# Patient Record
Sex: Male | Born: 1970 | Race: White | Hispanic: No | Smoking: Never smoker
Health system: Southern US, Community
[De-identification: ages and names within clinical notes are randomized; demographics above are authoritative.]

## PROBLEM LIST (undated history)

## (undated) DIAGNOSIS — E785 Hyperlipidemia, unspecified: Secondary | ICD-10-CM

## (undated) HISTORY — DX: Hyperlipidemia, unspecified: E78.5

## (undated) HISTORY — PX: APPENDECTOMY: SHX54

---

## 2003-04-05 ENCOUNTER — Emergency Department (HOSPITAL_COMMUNITY): Admission: EM | Admit: 2003-04-05 | Discharge: 2003-04-05 | Payer: Self-pay | Admitting: Emergency Medicine

## 2003-04-05 ENCOUNTER — Encounter: Payer: Self-pay | Admitting: Emergency Medicine

## 2003-04-06 ENCOUNTER — Emergency Department (HOSPITAL_COMMUNITY): Admission: EM | Admit: 2003-04-06 | Discharge: 2003-04-06 | Payer: Self-pay | Admitting: Emergency Medicine

## 2004-09-18 ENCOUNTER — Emergency Department (HOSPITAL_COMMUNITY): Admission: EM | Admit: 2004-09-18 | Discharge: 2004-09-18 | Payer: Self-pay | Admitting: *Deleted

## 2007-07-23 ENCOUNTER — Emergency Department (HOSPITAL_COMMUNITY): Admission: EM | Admit: 2007-07-23 | Discharge: 2007-07-23 | Payer: Self-pay | Admitting: Emergency Medicine

## 2011-03-12 LAB — URINE MICROSCOPIC-ADD ON

## 2011-03-12 LAB — BASIC METABOLIC PANEL
BUN: 10
Chloride: 104
GFR calc non Af Amer: >60
Potassium: 3.4 — ABNORMAL LOW
Sodium: 140

## 2011-03-12 LAB — URINALYSIS, ROUTINE W REFLEX MICROSCOPIC
Bilirubin Urine: NEGATIVE
Glucose, UA: NEGATIVE
Ketones, ur: NEGATIVE
Leukocytes, UA: NEGATIVE
Protein, ur: NEGATIVE

## 2011-03-12 LAB — DIFFERENTIAL
Eosinophils Absolute: 0.1
Eosinophils Relative: 2
Lymphocytes Relative: 32
Lymphs Abs: 2
Monocytes Absolute: 0.4
Monocytes Relative: 6

## 2011-03-12 LAB — CBC
HCT: 39.3
Hemoglobin: 14
MCV: 87.5
Platelets: 270
RBC: 4.49
WBC: 6.2

## 2015-03-28 ENCOUNTER — Encounter: Payer: Self-pay | Admitting: Pediatrics

## 2015-03-28 ENCOUNTER — Ambulatory Visit (INDEPENDENT_AMBULATORY_CARE_PROVIDER_SITE_OTHER): Payer: BLUE CROSS/BLUE SHIELD | Admitting: Pediatrics

## 2015-03-28 ENCOUNTER — Ambulatory Visit (INDEPENDENT_AMBULATORY_CARE_PROVIDER_SITE_OTHER): Payer: BLUE CROSS/BLUE SHIELD

## 2015-03-28 VITALS — BP 131/82 | HR 79 | Temp 99.2°F

## 2015-03-28 DIAGNOSIS — M109 Gout, unspecified: Secondary | ICD-10-CM

## 2015-03-28 MED ORDER — NAPROXEN 500 MG PO TABS: 500.0000 mg | ORAL_TABLET | Freq: Two times a day (BID) | ORAL | Status: DC

## 2015-03-28 NOTE — Progress Notes (Signed)
    Subjective:    Patient ID: Gerald Holland, male    DOB: 03/12/1971, 45 y.o.   MRN: [id number]  CC: ankle pain  HPI: Gerald Holland is a 44 y.o. male presenting on 03/28/2015 for Foot Swelling  Woke up two days ago with pain and swelling, redness in L ankle. Ibuprofen helped a little bit Worked ten hours last night, on his feet, can walk but it hurts to weight bear No fevers, feeling otherwise well. H/o 3 kidney stones, all more than 8 years ago. His brother has gout  He doesn't know what kind of kidney stones he had, passed them. Abd surgery in 6th grade, took out appendix and "sack that ruptured". Also has had cold symptoms for apprx the last week, taking mucinex. No injury to ankle, no cuts or scrapes.   Relevant past medical, surgical, family and social history reviewed and updated. Medical history  reviewed. Allergies and medications reviewed.  ROS: All systems negative other than what is in HPI  Past Medical History There are no active problems to display for this patient.   Current Outpatient Prescriptions  Medication Sig Dispense Refill  . naproxen (NAPROSYN) 500 MG tablet Take 1 tablet (500 mg total) by mouth 2 (two) times daily with a meal. 60 tablet 1   No current facility-administered medications for this visit.       Objective:    BP 131/82 mmHg  Pulse 79  Temp(Src) 99.2 F (37.3 C) (Oral)  Wt Readings from Last 3 Encounters:  No data found for Wt    Gen: NAD, alert, cooperative with exam, NCAT EYES: EOMI, no scleral injection or icterus ENT:  TMs pearly gray b/l, OP without erythema LYMPH: no cervical LAD CV: NRRR, normal S1/S2, no murmur, DP pulses 2+ b/l Resp: CTABL, no wheezes, normal WOB Abd: +BS, soft, NTND. no guarding or organomegaly Neuro: Alert and oriented, strength equal b/l UE and LE   MSK: L ankle red, swollen, able to wiggle toes without pain, minimal ROM of ankle, limited by pain. Pain with palpation lateral side of ankle. No point  tenderness over fibula, or metatarsals.     Assessment & Plan:    Gerald Holland was seen today for foot swelling that started suddenly one morning. No cuts or scarpes around ankle, is otherwise healthy, no fevers, no systemic illness. Given suddenness of onset, no injury, no fevers, most likely gout. Ideally would have fluid to send, discussed with pt trial of NSAIDs, will send CBC as well. If he has fevers he needs to be seen, evaluated for septic arthritis.   Diagnoses and all orders for this visit:  Acute gout, unspecified cause, unspecified site -     CBC with Differential -     DG Ankle Complete Left; Future -     naproxen (NAPROSYN) 500 MG tablet; Take 1 tablet (500 mg total) by mouth 2 (two) times daily with a meal   Follow up plan: Return if symptoms worsen or fail to improve.  Rex Kras, MD [facility] 03/28/2015, 3:42 PM

## 2015-03-29 LAB — CBC WITH DIFFERENTIAL/PLATELET
BASOS ABS: 0 10*3/uL (ref 0.0–0.2)
Basos: 0 %
EOS (ABSOLUTE): 0.3 10*3/uL (ref 0.0–0.4)
EOS: 4 %
HEMATOCRIT: 43.6 % (ref 37.5–51.0)
HEMOGLOBIN: 15 g/dL (ref 12.6–17.7)
IMMATURE GRANS (ABS): 0 10*3/uL (ref 0.0–0.1)
Immature Granulocytes: 0 %
LYMPHS ABS: 2.2 10*3/uL (ref 0.7–3.1)
LYMPHS: 25 %
MCH: 30.7 pg (ref 26.6–33.0)
MCHC: 34.4 g/dL (ref 31.5–35.7)
MCV: 89 fL (ref 79–97)
MONOCYTES: 8 %
Monocytes Absolute: 0.7 10*3/uL (ref 0.1–0.9)
NEUTROS ABS: 5.5 10*3/uL (ref 1.4–7.0)
Neutrophils: 63 %
Platelets: 249 10*3/uL (ref 150–379)
RBC: 4.89 x10E6/uL (ref 4.14–5.80)
RDW: 13.9 % (ref 12.3–15.4)
WBC: 8.8 10*3/uL (ref 3.4–10.8)

## 2015-04-03 ENCOUNTER — Encounter: Payer: Self-pay | Admitting: Family Medicine

## 2015-04-03 ENCOUNTER — Ambulatory Visit (INDEPENDENT_AMBULATORY_CARE_PROVIDER_SITE_OTHER): Payer: BLUE CROSS/BLUE SHIELD | Admitting: Family Medicine

## 2015-04-03 VITALS — BP 119/82 | HR 70 | Temp 97.2°F | Ht 77.0 in | Wt 264.0 lb

## 2015-04-03 DIAGNOSIS — M10072 Idiopathic gout, left ankle and foot: Secondary | ICD-10-CM

## 2015-04-03 DIAGNOSIS — M109 Gout, unspecified: Secondary | ICD-10-CM

## 2015-04-03 MED ORDER — PREDNISONE 20 MG PO TABS: ORAL_TABLET | ORAL | Status: DC

## 2015-04-03 MED ORDER — TRAMADOL HCL 50 MG PO TABS: 50.0000 mg | ORAL_TABLET | Freq: Three times a day (TID) | ORAL | Status: DC | PRN

## 2015-04-03 NOTE — Progress Notes (Signed)
   HPI  Patient presents today for acute gout flare.  Patient explains that he's had symptoms for about 6-7 days. He was started on naproxen which helped some but the pain continues. He had a normal x-ray and a normal CBC. He denies fever, chills, sweats, or previous gout attacks. He has some family history of gout, his brother has gout. He is no inciting events. 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   Meds ordered this encounter  Medications  . predniSONE (DELTASONE) 20 MG tablet    Sig: Take 2 tabs daily for 4 days, then take 1 tab daily for 4 days, then take one half tab daily 4 days then stop.    Dispense:  14 tablet    Refill:  0  . traMADol (ULTRAM) 50 MG tablet    Sig: Take 1 tablet (50 mg total) by mouth every 8 (eight) hours as needed.    Dispense:  20 tablet    Refill:  0    Murtis SinkSam Vincenzina Jagoda, MD [facility] 04/03/2015, 9:40 AM

## 2015-04-03 NOTE — Patient Instructions (Signed)
Great to meet you guys!  Tramadol is a pian medicine to take only as needed, do not drive while taking it.    Gout Gout is when your joints become red, sore, and swell (inflamed). This is caused by the buildup of uric acid crystals in the joints. Uric acid is a chemical that is normally in the blood. If the level of uric acid gets too high in the blood, these crystals form in your joints and tissues. Over time, these crystals can form into masses near the joints and tissues. These masses can destroy bone and cause the bone to look misshapen (deformed). HOME CARE   Do not take aspirin for pain.  Only take medicine as told by your doctor.  Rest the joint as much as you can. When in bed, keep sheets and blankets off painful areas.  Keep the sore joints raised (elevated).  Put warm or cold packs on painful joints. Use of warm or cold packs depends on which works best for you.  Use crutches if the painful joint is in your leg.  Drink enough fluids to keep your pee (urine) clear or pale yellow. Limit alcohol, sugary drinks, and drinks with fructose in them.  Follow your diet instructions. Pay careful attention to how much protein you eat. Include fruits, vegetables, whole grains, and fat-free or low-fat milk products in your daily diet. Talk to your doctor or dietitian about the use of coffee, vitamin C, and cherries. These may help lower uric acid levels.  Keep a healthy body weight. GET HELP RIGHT AWAY IF:   You have watery poop (diarrhea), throw up (vomit), or have any side effects from medicines.  You do not feel better in 24 hours, or you are getting worse.  Your joint becomes suddenly more tender, and you have chills or a fever. MAKE SURE YOU:   Understand these instructions.  Will watch your condition.  Will get help right away if you are not doing well or get worse.   This information is not intended to replace advice given to you by your health care provider. Make sure you  discuss any questions you have with your health care provider.   Document Released: 03/16/2008 Document Revised: 06/28/2014 Document Reviewed: 01/19/2012 Elsevier Interactive Patient Education [employer]2016 [employer]

## 2016-01-01 ENCOUNTER — Encounter: Payer: Self-pay | Admitting: Pediatrics

## 2016-01-01 ENCOUNTER — Ambulatory Visit (INDEPENDENT_AMBULATORY_CARE_PROVIDER_SITE_OTHER): Payer: BLUE CROSS/BLUE SHIELD | Admitting: Pediatrics

## 2016-01-01 VITALS — BP 143/96 | HR 100 | Temp 97.9°F | Ht 77.0 in | Wt 269.0 lb

## 2016-01-01 DIAGNOSIS — IMO0001 Reserved for inherently not codable concepts without codable children: Secondary | ICD-10-CM

## 2016-01-01 DIAGNOSIS — R030 Elevated blood-pressure reading, without diagnosis of hypertension: Secondary | ICD-10-CM | POA: Diagnosis not present

## 2016-01-01 DIAGNOSIS — M10072 Idiopathic gout, left ankle and foot: Secondary | ICD-10-CM | POA: Diagnosis not present

## 2016-01-01 DIAGNOSIS — M109 Gout, unspecified: Secondary | ICD-10-CM

## 2016-01-01 MED ORDER — PREDNISONE 20 MG PO TABS
ORAL_TABLET | ORAL | Status: DC
Start: 2016-01-01 — End: 2016-09-27

## 2016-01-01 NOTE — Progress Notes (Signed)
    Subjective:    Patient ID: Gerald Holland, male    DOB: 05/25/1971, 45 y.o.   MRN: [id number]  CC: Foot Pain   HPI: Gerald Holland is a 45 y.o. male presenting for Foot Pain  Yesterday foot slightly sore Woke up this morning with very tender L foot Anything touching foot hurts it Taking BC powder today Ibuprofen 800mg  once yesterday Had gout flare apprx 8 months ago, at that time L ankle was more involved than L foot No fevers No injuries to foot  Otherwise feeling well No headaches, chest pain, vision changes  Relevant past medical, surgical, family and social history reviewed and updated as indicated.  Interim medical history since our last visit reviewed. Allergies and medications reviewed and updated.  ROS: Per HPI     Objective:    BP 143/96 mmHg  Pulse 100  Temp(Src) 97.9 F (36.6 C) (Oral)  Ht 6\' 5"  (1.956 m)  Wt 269 lb (122.018 kg)  BMI 31.89 kg/m2  Wt Readings from Last 3 Encounters:  01/01/16 269 lb (122.018 kg)  04/03/15 264 lb (119.75 kg)     Gen: NAD, alert, cooperative with exam, NCAT EYES: EOMI, no scleral injection or icterus Ext: L foot slightly swollen  Neuro: Alert and oriented MSK: Tender with slightest touch over L lateral side of foot, area slightly erythematous No breask in skin Nl ROM ankle, can wiggle toes, but hurts L side of foot Can walk on foot but same area hurts     Assessment & Plan:    Gerald Holland was seen today for foot pain.  Diagnoses and all orders for this visit:  Acute gout of left foot, unspecified cause -     predniSONE (DELTASONE) 20 MG tablet; Take 2 tabs daily for 4 days, then take 1 tab daily for 4 days, then take one half tab daily 4 days then stop.  Ibuprofen 800mg  three times a day  OR   Naproxen 500mg  twice a day for next 7 days or until improving Start as soon as symptoms start with next gout flare  Avoid foods that cause gout  Elevated blood pressure RTC for recheck in 2 weeks  Follow up  plan: 2 week nurse visit for BP check  Rex Krasarol Stanely Sexson, MD [facility] 01/01/2016, 5:14 PM

## 2016-01-01 NOTE — Patient Instructions (Signed)
Ibuprofen 800mg  three times a day  OR   Naproxen 500mg  twice a day for next 7 days or until improving Start as soon as symptoms start with next gout flare

## 2016-09-27 ENCOUNTER — Ambulatory Visit (INDEPENDENT_AMBULATORY_CARE_PROVIDER_SITE_OTHER): Payer: BLUE CROSS/BLUE SHIELD

## 2016-09-27 ENCOUNTER — Ambulatory Visit (INDEPENDENT_AMBULATORY_CARE_PROVIDER_SITE_OTHER): Payer: BLUE CROSS/BLUE SHIELD | Admitting: Family Medicine

## 2016-09-27 ENCOUNTER — Encounter: Payer: Self-pay | Admitting: Family Medicine

## 2016-09-27 ENCOUNTER — Encounter: Payer: Self-pay | Admitting: *Deleted

## 2016-09-27 VITALS — BP 127/74 | HR 93 | Temp 98.5°F | Ht 77.0 in | Wt 271.0 lb

## 2016-09-27 DIAGNOSIS — M79672 Pain in left foot: Secondary | ICD-10-CM | POA: Diagnosis not present

## 2016-09-27 DIAGNOSIS — S93602A Unspecified sprain of left foot, initial encounter: Secondary | ICD-10-CM

## 2016-09-27 NOTE — Patient Instructions (Addendum)
Use ice off and on for the first 48 hours then warm wet compresses Take Aleve 1 twice daily after breakfast and supper for 7-10 days unless it irritates the stomach We will give you a note for work for the next 2-3 days Elevate the foot when possible The x-rays that were reviewed by me appeared to be normal and we will get an official report from the radiologist and call you soon as that becomes available Wear Ace bandage and apply daily or reapply if it becomes loose and Remain out of work for the next 2-3 days as directed

## 2016-09-27 NOTE — Progress Notes (Signed)
   Subjective:    Patient ID: Gerald Holland, male    DOB: [birth year], 46 y.o.   MRN: [id number]  HPI  Patient here today for left foot pain. Yesterday while playing basketball, he heard a "pop".     Patient Active Problem List   Diagnosis Date Noted  . Gout attack 04/03/2015   Outpatient Encounter Prescriptions as of 09/27/2016  Medication Sig  . [DISCONTINUED] naproxen (NAPROSYN) 500 MG tablet Take 1 tablet (500 mg total) by mouth 2 (two) times daily with a meal.  . [DISCONTINUED] predniSONE (DELTASONE) 20 MG tablet Take 2 tabs daily for 4 days, then take 1 tab daily for 4 days, then take one half tab daily 4 days then stop.   No facility-administered encounter medications on file as of 09/27/2016.      Review of Systems  Constitutional: Negative.   HENT: Negative.   Eyes: Negative.   Respiratory: Negative.   Cardiovascular: Negative.   Gastrointestinal: Negative.   Endocrine: Negative.   Genitourinary: Negative.   Musculoskeletal: Positive for arthralgias (left foot pain).  Skin: Negative.   Allergic/Immunologic: Negative.   Neurological: Negative.   Hematological: Negative.   Psychiatric/Behavioral: Negative.        Objective:   Physical Exam  Constitutional: He is oriented to person, place, and time. He appears well-developed and well-nourished. No distress.  The patient is a pleasant and alert large framed person that is not overweight.  HENT:  Head: Normocephalic.  Eyes: Conjunctivae and EOM are normal. Pupils are equal, round, and reactive to light. Right eye exhibits no discharge. Left eye exhibits no discharge. No scleral icterus.  Neck: Normal range of motion.  Musculoskeletal: Normal range of motion. He exhibits tenderness.  Slight tenderness over the plantar metatarsal area without significant swelling. Fairly good mobility.  Neurological: He is alert and oriented to person, place, and time.  Skin: Skin is warm and dry. No rash noted.  Psychiatric: He has  a normal mood and affect. His behavior is normal. Judgment and thought content normal.  Nursing note and vitals reviewed.  BP 127/74 (BP Location: Left Arm)   Pulse 93   Temp 98.5 F (36.9 C) (Oral)   Ht  (1.956 m)   Wt 271 lb (122.9 kg)   BMI 32.14 kg/m         Assessment & Plan:  1. Left foot pain -No obvious fracture observed but awaiting opinion from radiologist - DG Foot Complete Left; Future  2. Foot sprain, left, initial encounter -Take Aleve 1 twice daily after breakfast and supper -Use ice for the first 48 hours then warm wet compresses -Wear Ace bandage as directed for the remainder of this week.  Patient Instructions  Use ice off and on for the first 48 hours then warm wet compresses Take Aleve 1 twice daily after breakfast and supper for 7-10 days unless it irritates the stomach We will give you a note for work for the next 2-3 days Elevate the foot when possible The x-rays that were reviewed by me appeared to be normal and we will get an official report from the radiologist and call you soon as that becomes available Wear Ace bandage and apply daily or reapply if it becomes loose and Remain out of work for the next 2-3 days as directed  Nyra Capes MD

## 2019-02-12 IMAGING — DX DG FOOT COMPLETE 3+V*L*
3 series · 3 of 3 positions shown · non-contrast
Comparison: None.

CLINICAL DATA: Fall, felt pop playing basketball.

EXAM:
LEFT FOOT - COMPLETE 3+ VIEW

[foot ap]
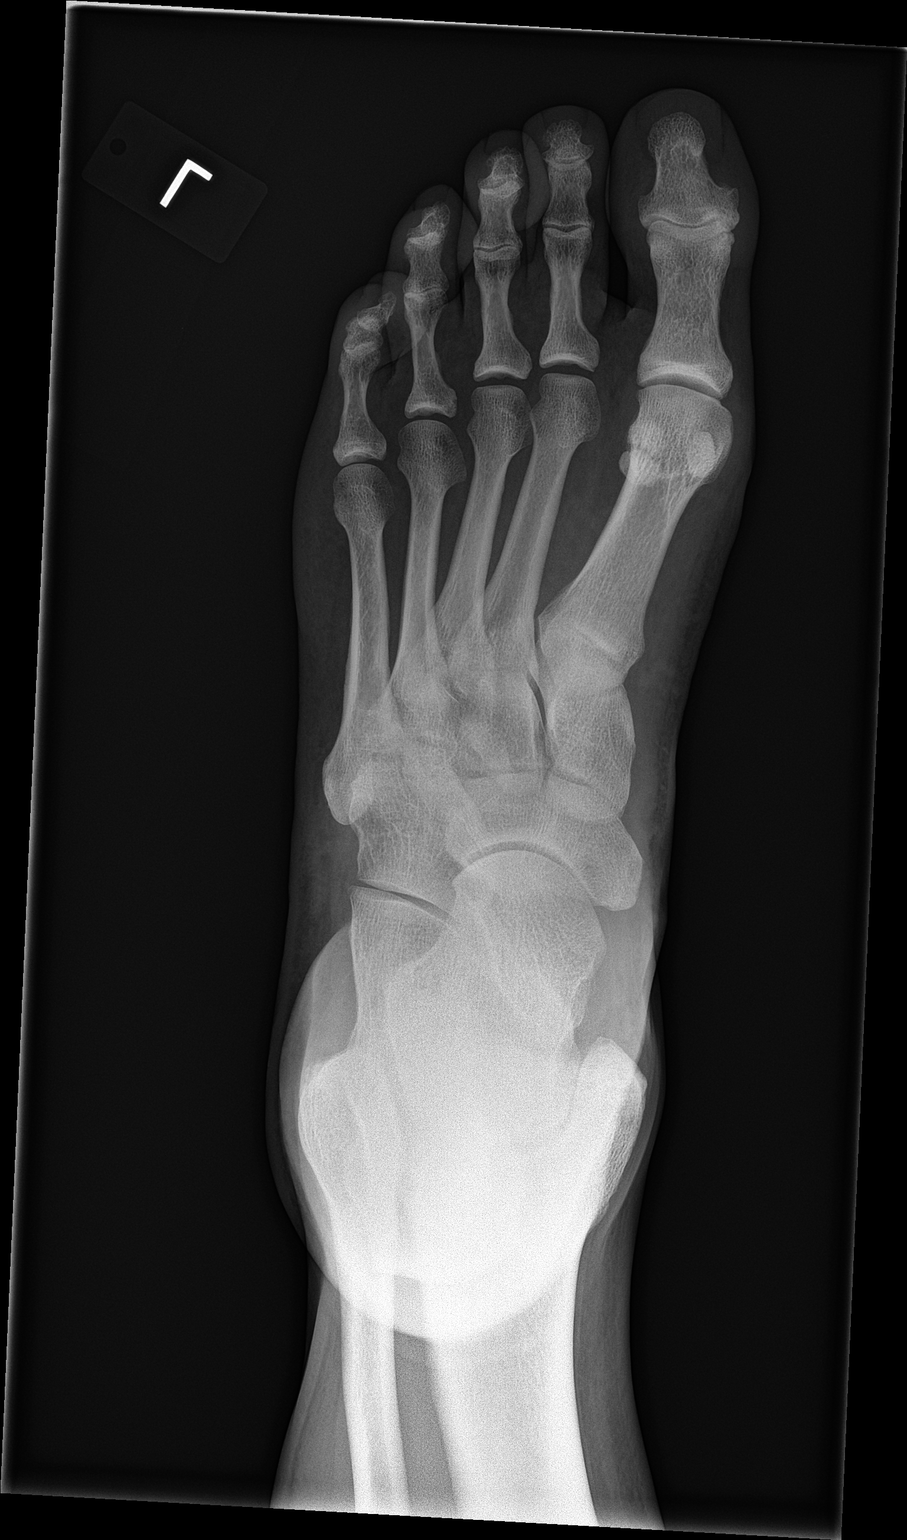

[foot obl]
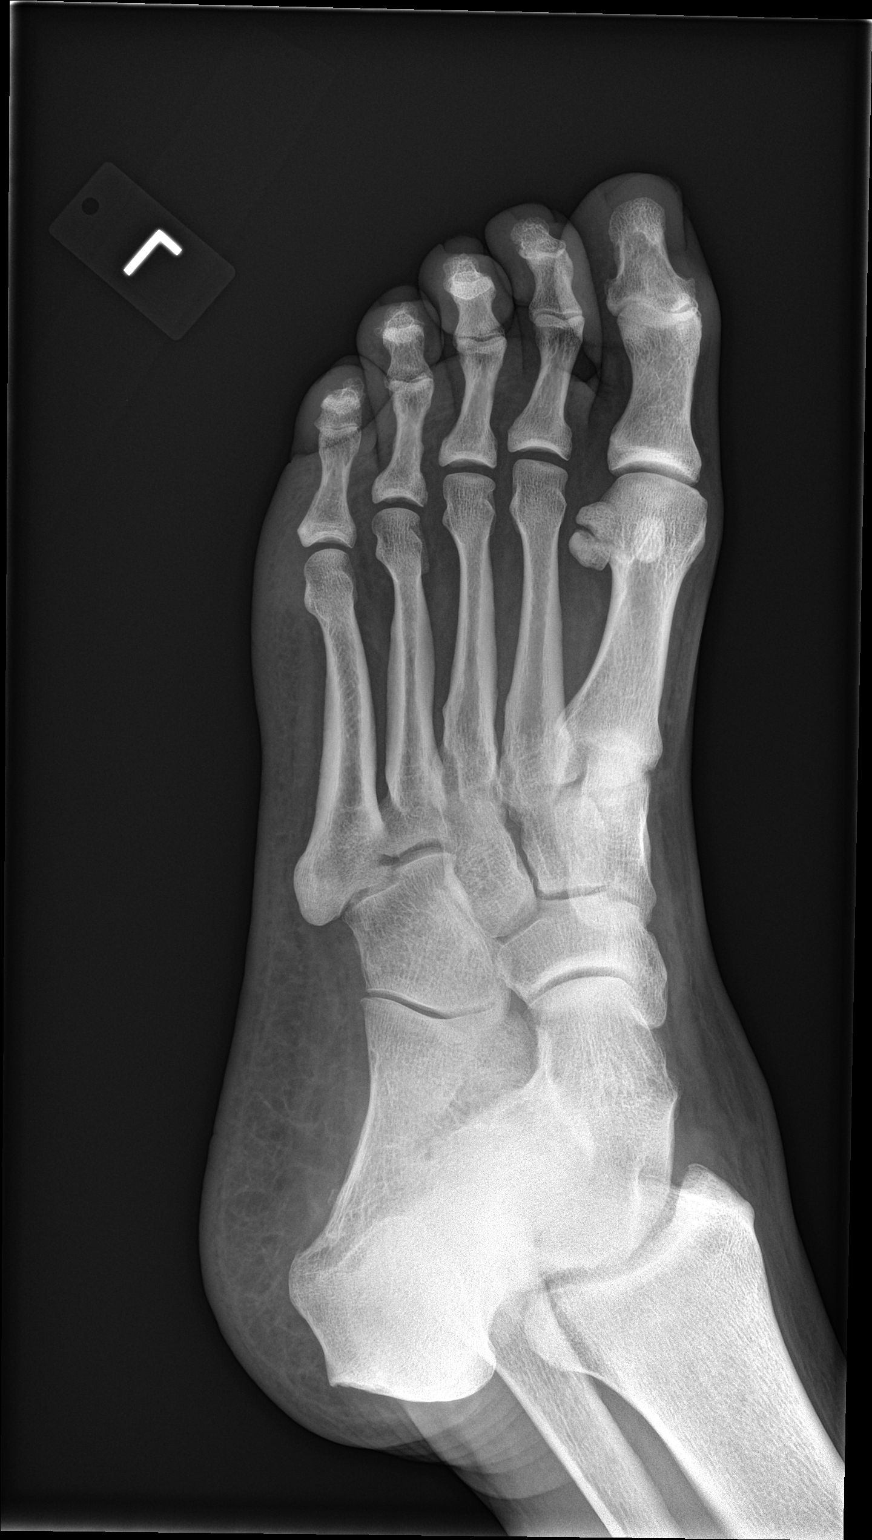

[foot lat]
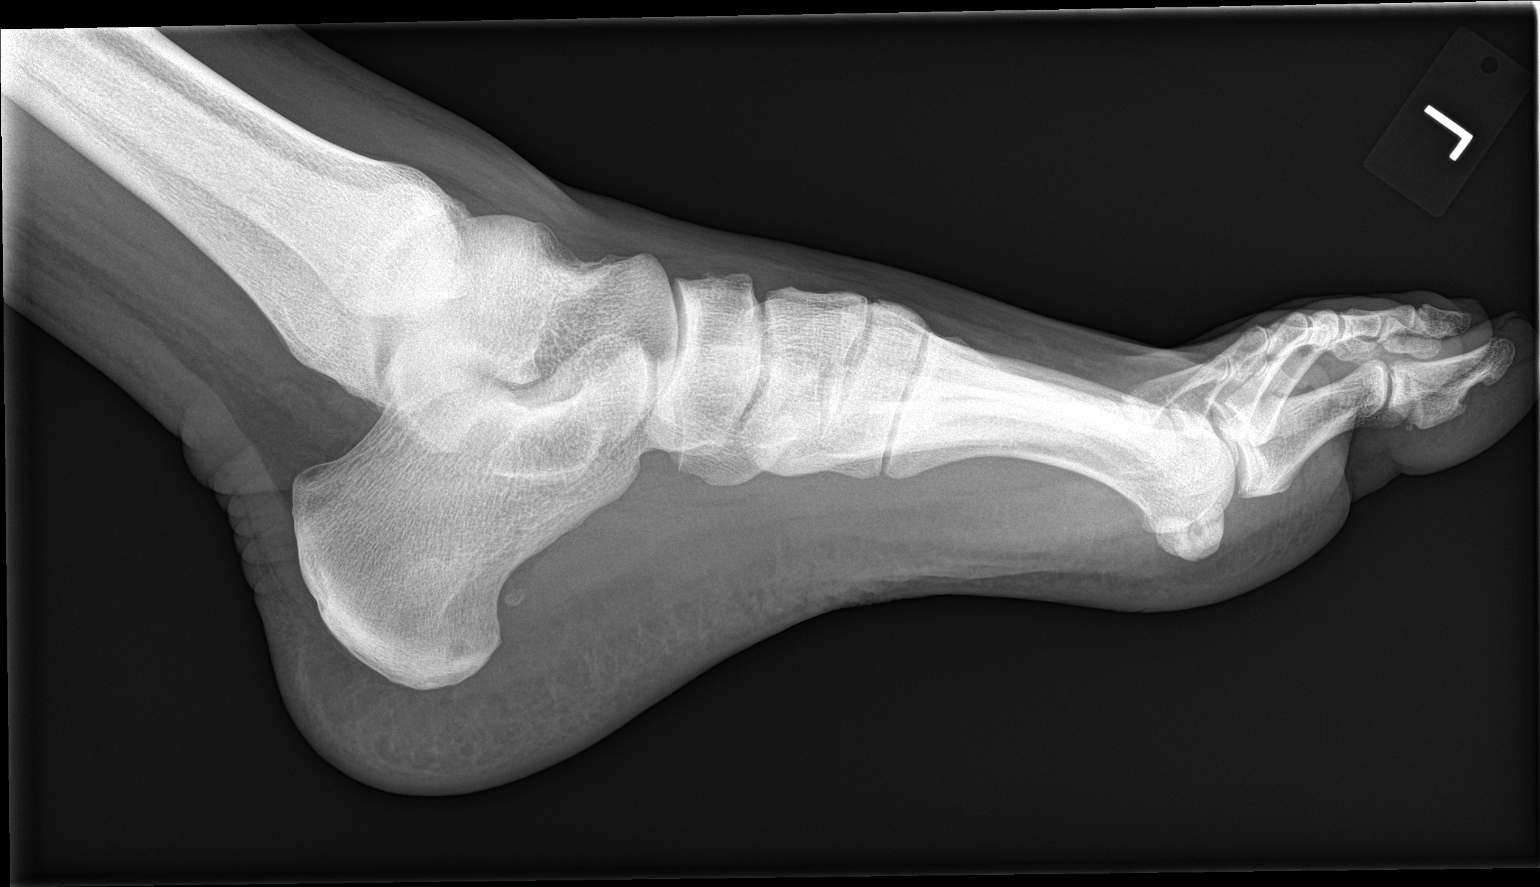

[3 of 3 positions shown; findings below may reference images not displayed]

FINDINGS: No acute bony abnormality. Specifically, no fracture, subluxation,
or dislocation. Soft tissues are intact. Joint spaces are
maintained.
IMPRESSION: No acute bony abnormality.
